# Patient Record
Sex: Female | Born: 1940 | Race: White | Hispanic: No | Smoking: Former smoker
Health system: Southern US, Community
[De-identification: ages and names within clinical notes are randomized; demographics above are authoritative.]

## PROBLEM LIST (undated history)

## (undated) DIAGNOSIS — E079 Disorder of thyroid, unspecified: Secondary | ICD-10-CM

## (undated) DIAGNOSIS — K219 Gastro-esophageal reflux disease without esophagitis: Secondary | ICD-10-CM

## (undated) DIAGNOSIS — M797 Fibromyalgia: Secondary | ICD-10-CM

## (undated) DIAGNOSIS — J383 Other diseases of vocal cords: Secondary | ICD-10-CM

## (undated) DIAGNOSIS — I10 Essential (primary) hypertension: Secondary | ICD-10-CM

## (undated) HISTORY — PX: JOINT REPLACEMENT: SHX530

## (undated) HISTORY — DX: Fibromyalgia: M79.7

## (undated) HISTORY — DX: Other diseases of vocal cords: J38.3

## (undated) HISTORY — DX: Gastro-esophageal reflux disease without esophagitis: K21.9

## (undated) HISTORY — DX: Disorder of thyroid, unspecified: E07.9

## (undated) HISTORY — DX: Essential (primary) hypertension: I10

---

## 2009-04-13 ENCOUNTER — Ambulatory Visit (HOSPITAL_COMMUNITY): Admission: RE | Admit: 2009-04-13 | Discharge: 2009-04-13 | Payer: Self-pay | Admitting: Gastroenterology

## 2010-11-17 ENCOUNTER — Emergency Department (HOSPITAL_COMMUNITY)
Admission: EM | Admit: 2010-11-17 | Discharge: 2010-11-17 | Disposition: A | Discharge status: Home / Self Care | Payer: Medicare Other | Attending: Emergency Medicine | Admitting: Emergency Medicine

## 2010-11-17 DIAGNOSIS — I10 Essential (primary) hypertension: Secondary | ICD-10-CM | POA: Insufficient documentation

## 2010-11-17 DIAGNOSIS — M79609 Pain in unspecified limb: Secondary | ICD-10-CM | POA: Insufficient documentation

## 2010-11-17 DIAGNOSIS — Z79899 Other long term (current) drug therapy: Secondary | ICD-10-CM | POA: Insufficient documentation

## 2010-11-17 DIAGNOSIS — E039 Hypothyroidism, unspecified: Secondary | ICD-10-CM | POA: Insufficient documentation

## 2010-11-17 DIAGNOSIS — M7989 Other specified soft tissue disorders: Secondary | ICD-10-CM

## 2010-11-17 LAB — BASIC METABOLIC PANEL
CO2: 27 mEq/L (ref 19–32)
Calcium: 9 mg/dL (ref 8.4–10.5)
Creatinine, Ser: 0.68 mg/dL (ref 0.50–1.10)
GFR calc Af Amer: >60 mL/min (ref >60)
GFR calc non Af Amer: >60 mL/min (ref >60)
Sodium: 137 mEq/L (ref 135–145)

## 2010-11-17 LAB — CBC
MCH: 31.4 pg (ref 26.0–34.0)
MCHC: 33.4 g/dL (ref 30.0–36.0)
MCV: 94 fL (ref 78.0–100.0)
Platelets: 196 10*3/uL (ref 150–400)
RBC: 3.15 MIL/uL — ABNORMAL LOW (ref 3.87–5.11)
RDW: 13.4 % (ref 11.5–15.5)

## 2010-11-17 LAB — DIFFERENTIAL
Basophils Relative: 0 % (ref 0–1)
Eosinophils Absolute: 0.1 10*3/uL (ref 0.0–0.7)
Eosinophils Relative: 1 % (ref 0–5)
Monocytes Relative: 8 % (ref 3–12)
Neutrophils Relative %: 71 % (ref 43–77)

## 2013-08-16 ENCOUNTER — Telehealth: Payer: Self-pay | Admitting: Interventional Cardiology

## 2013-08-16 NOTE — Telephone Encounter (Signed)
New message          There is an opening at the [facility] after 3pm today for a CT scan at the [facility] for [facility] / phone number [phone]. Could you let pt know that it is ok so that they can go to this appt.

## 2015-04-06 ENCOUNTER — Ambulatory Visit
Admission: RE | Admit: 2015-04-06 | Discharge: 2015-04-06 | Disposition: A | Discharge status: Home / Self Care | Payer: Medicare Other | Source: Ambulatory Visit | Attending: Internal Medicine | Admitting: Internal Medicine

## 2015-04-06 ENCOUNTER — Other Ambulatory Visit: Payer: Self-pay | Admitting: Internal Medicine

## 2015-04-06 DIAGNOSIS — M5412 Radiculopathy, cervical region: Secondary | ICD-10-CM

## 2016-03-07 ENCOUNTER — Encounter: Payer: Self-pay | Admitting: Emergency Medicine

## 2016-03-07 ENCOUNTER — Emergency Department (INDEPENDENT_AMBULATORY_CARE_PROVIDER_SITE_OTHER): Payer: Medicare HMO

## 2016-03-07 ENCOUNTER — Emergency Department
Admission: EM | Admit: 2016-03-07 | Discharge: 2016-03-07 | Disposition: A | Discharge status: Home / Self Care | Payer: Medicare HMO | Source: Home / Self Care | Attending: Family Medicine | Admitting: Family Medicine

## 2016-03-07 DIAGNOSIS — R1032 Left lower quadrant pain: Secondary | ICD-10-CM

## 2016-03-07 DIAGNOSIS — K5900 Constipation, unspecified: Secondary | ICD-10-CM

## 2016-03-07 LAB — POCT CBC W AUTO DIFF (K'VILLE URGENT CARE)

## 2016-03-07 MED ORDER — CIPROFLOXACIN HCL 500 MG PO TABS: 500.0000 mg | ORAL_TABLET | Freq: Two times a day (BID) | ORAL | 0 refills | Status: DC

## 2016-03-07 MED ORDER — METRONIDAZOLE 500 MG PO TABS: 500.0000 mg | ORAL_TABLET | Freq: Four times a day (QID) | ORAL | 0 refills | Status: DC

## 2016-03-07 NOTE — ED Triage Notes (Addendum)
Constipation, patient had a BM on Tuesday night and one when she arrived at our office tonight. She has been taking Senna and Probiotics for a few weeks ans has been experiencing cramping since last night. Has an appt with her PCP in the morning.

## 2016-03-07 NOTE — Discharge Instructions (Signed)
Begin clear liquids for about 18 to 24 hours, then may begin a [employer] (Bananas, Rice, Applesauce, Toast). May take Tylenol as needed for fever, pain.   If symptoms become significantly worse during the night or over the weekend, proceed to the local emergency room.

## 2016-03-07 NOTE — ED Provider Notes (Signed)
[facility]    CSN: [id number] Arrival date & time: 03/07/16  1851     History   Chief Complaint Chief Complaint  Patient presents with  . Constipation    HPI Kathryn Pope is a 75 y.o. female.   Patient complains of increased constipation for about 3 weeks.  She has been taking Senna and probiotics without much improvement.  Last night she began developing abdominal cramping, and today has had increased fatigue and chills.  No nausea/vomiting.  She had a normal bowel movement two days ago, and one when she arrived at our office. She had a negative colonoscopy 5 years ago.  She has a history of hypothyroid. Patient reports that she has a follow-up appointment with her PCP tomorrow.   The history is provided by the patient.  Constipation  Severity:  Moderate Time since last bowel movement:  1 hour Timing:  Intermittent Progression:  Worsening Chronicity:  New Context: not dehydration, not dietary changes, not medication, not narcotics and not stress   Stool description:  Formed Relieved by:  Stool softeners and laxatives Worsened by:  Nothing Ineffective treatments:  Diet changes Associated symptoms: abdominal pain   Associated symptoms: no anorexia, no back pain, no diarrhea, no dysuria, no fever, no flatus, no hematochezia, no nausea, no urinary retention and no vomiting   Risk factors comment:  Hypothyroid   Past Medical History:  Diagnosis Date  . Dysphonia spastica   . Fibromyalgia   . GERD (gastroesophageal reflux disease)   . Hypertension   . Thyroid disease     Active Medical Problems:  Hypertension; hypothyroid   History reviewed:  Laminectomy L4-5 Bilateral total hip replacements 2012  OB History    No data available       Home Medications    Prior to Admission medications   Medication Sig Start Date End Date Taking? Authorizing Provider  Probiotic Product (SOLUBLE FIBER/PROBIOTICS PO) Take by mouth.   Yes Historical Provider, MD   sennosides-docusate sodium (SENOKOT-S) 8.6-50 MG tablet Take 1 tablet by mouth daily.   Yes Historical Provider, MD  ciprofloxacin (CIPRO) 500 MG tablet Take 1 tablet (500 mg total) by mouth 2 (two) times daily. 03/07/16   Kandra Nicolas, MD  HYDROcodone-acetaminophen (NORCO/VICODIN) 5-325 MG per tablet Take 1 tablet by mouth every 6 (six) hours as needed for moderate pain.    Historical Provider, MD  ibuprofen (ADVIL,MOTRIN) 200 MG tablet Take 200 mg by mouth every 6 (six) hours as needed.    Historical Provider, MD  levothyroxine (SYNTHROID, LEVOTHROID) 75 MCG tablet Take 75 mcg by mouth daily before breakfast.    Historical Provider, MD  metaxalone (SKELAXIN) 800 MG tablet Take 800 mg by mouth 3 (three) times daily.    Historical Provider, MD  methocarbamol (ROBAXIN) 500 MG tablet Take 500 mg by mouth 4 (four) times daily.    Historical Provider, MD  metroNIDAZOLE (FLAGYL) 500 MG tablet Take 1 tablet (500 mg total) by mouth 4 (four) times daily. 03/07/16   Kandra Nicolas, MD  nitroGLYCERIN (NITROSTAT) 0.4 MG SL tablet Place 0.4 mg under the tongue every 5 (five) minutes as needed for chest pain.    Historical Provider, MD  omeprazole (PRILOSEC) 20 MG capsule Take 20 mg by mouth daily.    Historical Provider, MD  valsartan (DIOVAN) 320 MG tablet Take 320 mg by mouth daily.    Historical Provider, MD  VITAMIN D, CHOLECALCIFEROL, PO Take by mouth.    Historical Provider,  MD    Family History No family history on file.  Social History Social History  Substance Use Topics  . Smoking status: Former Research scientist (life sciences)  . Smokeless tobacco: Never Used     Comment: QUIT 1965  . Alcohol use Not on file     Allergies   Ace inhibitors and Hctz [hydrochlorothiazide]   Review of Systems Review of Systems  Constitutional: Positive for chills and fatigue. Negative for activity change, appetite change, diaphoresis, fever and unexpected weight change.  Gastrointestinal: Positive for abdominal pain and  constipation. Negative for anorexia, diarrhea, flatus, hematochezia, nausea and vomiting.  Genitourinary: Negative for dysuria.  Musculoskeletal: Negative for back pain.  All other systems reviewed and are negative.    Physical Exam Triage Vital Signs ED Triage Vitals  Enc Vitals Group     BP 03/07/16 1911 107/71     Pulse Rate 03/07/16 1911 107     Resp --      Temp 03/07/16 1911 98.1 F (36.7 C)     Temp Source 03/07/16 1911 Oral     SpO2 03/07/16 1911 97 %     Weight 03/07/16 1912 167 lb (75.8 kg)     Height 03/07/16 1912 5\' 5"  (1.651 m)     Head Circumference --      Peak Flow --      Pain Score 03/07/16 1915 3     Pain Loc --      Pain Edu? --      Excl. in [place]? --    No data found.   Updated Vital Signs BP 107/71 (BP Location: Left Arm)   Pulse 107   Temp 98.1 F (36.7 C) (Oral)   Ht 5\' 5"  (1.651 m)   Wt 167 lb (75.8 kg)   SpO2 97%   BMI 27.79 kg/m   Visual Acuity Right Eye Distance:   Left Eye Distance:   Bilateral Distance:    Right Eye Near:   Left Eye Near:    Bilateral Near:     Physical Exam Nursing notes and Vital Signs reviewed. Appearance:  Patient appears stated age, and in no acute distress Eyes:  Pupils are equal, round, and reactive to light and accomodation.  Extraocular movement is intact.  Conjunctivae are not inflamed  Ears:  Canals normal.  Tympanic membranes normal.  Nose:   Normal turbinates.  No sinus tenderness.     Pharynx:  Normal; moist mucous membranes  Neck:  Supple.  No adenopathy.  No thyromegaly. Lungs:  Clear to auscultation.  Breath sounds are equal.  Moving air well. Heart:  Regular rate and rhythm without murmurs, rubs, or gallops.  Rate 104 Abdomen:   Vague mild tenderness hypogastric area, more pronounced on the left.  No masses or hepatosplenomegaly.  Bowel sounds are present.  No CVA or flank tenderness.  Extremities:  No edema.  Skin:  No rash present.    UC Treatments / Results  Labs (all labs ordered are  listed, but only abnormal results are displayed) Labs Reviewed  POCT CBC W AUTO DIFF ([facility]):  WBC 15.8; LY 21.3; MO 4.1; GR 74.6; Hgb 14.4; Platelets 254     EKG  EKG Interpretation None       Radiology Dg Abdomen 1 View  Result Date: 03/07/2016 CLINICAL DATA:  Left lower quadrant pain EXAM: ABDOMEN - 1 VIEW COMPARISON:  None. FINDINGS: Visualized lung bases are clear. Nonobstructed gas pattern with large amount of stool in the left colon and  rectum. Faint opacities in the right hemi abdomen could be within the bowel stream. Status post bilateral hip replacement, left acetabular screw protrudes beyond left inner cortical margin of the pelvis. IMPRESSION: Nonobstructed bowel gas pattern with large amount of stool in the left colon and rectum Electronically Signed   By: Donavan Foil M.D.   On: 03/07/2016 20:19    Procedures Procedures (including critical care time)  Medications Ordered in UC Medications - No data to display   Initial Impression / Assessment and Plan / UC Course  I have reviewed the triage vital signs and the nursing notes.  Pertinent labs & imaging results that were available during my care of the patient were reviewed by me and considered in my medical decision making (see chart for details).  Clinical Course   Note significant leukocytosis 15.8 Suspect diverticulitis. Begin Cipro and Flagyl. Begin clear liquids for about 18 to 24 hours, then may begin a [employer] (Bananas, Rice, Applesauce, Toast). May take Tylenol as needed for fever, pain. Followup with PCP tomorrow as scheduled.  Note history of hypothyroid.  Patient's increasing constipation may be a consequence of decreased control of hypothyroid.  Will need repeat TSH.   Also recommend follow-up with orthopedist (note left acetabular screw protruding beyond left inner cortical margin of pelvis on KUB). If symptoms become significantly worse during the night or over the weekend, proceed to  the local emergency room.      Final Clinical Impressions(s) / UC Diagnoses   Final diagnoses:  Constipation, unspecified constipation type  Left lower quadrant pain    New Prescriptions New Prescriptions   CIPROFLOXACIN (CIPRO) 500 MG TABLET    Take 1 tablet (500 mg total) by mouth 2 (two) times daily.   METRONIDAZOLE (FLAGYL) 500 MG TABLET    Take 1 tablet (500 mg total) by mouth 4 (four) times daily.     Kandra Nicolas, MD 03/10/16 1145

## 2016-05-14 LAB — COLOGUARD: COLOGUARD: POSITIVE

## 2016-06-28 ENCOUNTER — Other Ambulatory Visit: Payer: Self-pay | Admitting: Gastroenterology

## 2016-07-15 ENCOUNTER — Other Ambulatory Visit: Payer: Self-pay | Admitting: Gastroenterology

## 2016-07-15 ENCOUNTER — Encounter (HOSPITAL_COMMUNITY): Payer: Self-pay | Admitting: Anesthesiology

## 2016-07-15 ENCOUNTER — Ambulatory Visit (HOSPITAL_COMMUNITY): Admission: RE | Admit: 2016-07-15 | Payer: Medicare Other | Source: Ambulatory Visit | Admitting: Gastroenterology

## 2016-07-15 SURGERY — COLONOSCOPY WITH PROPOFOL
Anesthesia: Monitor Anesthesia Care

## 2016-07-25 ENCOUNTER — Other Ambulatory Visit: Payer: Self-pay | Admitting: Gastroenterology

## 2016-08-13 ENCOUNTER — Ambulatory Visit (HOSPITAL_COMMUNITY)
Admission: RE | Admit: 2016-08-13 | Discharge: 2016-08-13 | Disposition: A | Discharge status: Home / Self Care | Payer: Medicare Other | Source: Ambulatory Visit | Attending: Gastroenterology | Admitting: Gastroenterology

## 2016-08-13 ENCOUNTER — Encounter (HOSPITAL_COMMUNITY)
Admission: RE | Disposition: A | Discharge status: Home / Self Care | Payer: Self-pay | Source: Ambulatory Visit | Attending: Gastroenterology

## 2016-08-13 ENCOUNTER — Ambulatory Visit (HOSPITAL_COMMUNITY): Payer: Medicare Other | Admitting: Certified Registered Nurse Anesthetist

## 2016-08-13 ENCOUNTER — Encounter (HOSPITAL_COMMUNITY): Payer: Self-pay | Admitting: *Deleted

## 2016-08-13 DIAGNOSIS — Z888 Allergy status to other drugs, medicaments and biological substances status: Secondary | ICD-10-CM | POA: Insufficient documentation

## 2016-08-13 DIAGNOSIS — K921 Melena: Secondary | ICD-10-CM | POA: Diagnosis not present

## 2016-08-13 DIAGNOSIS — Z1211 Encounter for screening for malignant neoplasm of colon: Secondary | ICD-10-CM | POA: Diagnosis present

## 2016-08-13 DIAGNOSIS — H409 Unspecified glaucoma: Secondary | ICD-10-CM | POA: Insufficient documentation

## 2016-08-13 DIAGNOSIS — D122 Benign neoplasm of ascending colon: Secondary | ICD-10-CM | POA: Insufficient documentation

## 2016-08-13 DIAGNOSIS — Z9841 Cataract extraction status, right eye: Secondary | ICD-10-CM | POA: Insufficient documentation

## 2016-08-13 DIAGNOSIS — E039 Hypothyroidism, unspecified: Secondary | ICD-10-CM | POA: Diagnosis not present

## 2016-08-13 DIAGNOSIS — Z880 Allergy status to penicillin: Secondary | ICD-10-CM | POA: Diagnosis not present

## 2016-08-13 DIAGNOSIS — I10 Essential (primary) hypertension: Secondary | ICD-10-CM | POA: Diagnosis not present

## 2016-08-13 DIAGNOSIS — Z981 Arthrodesis status: Secondary | ICD-10-CM | POA: Diagnosis not present

## 2016-08-13 DIAGNOSIS — Z96641 Presence of right artificial hip joint: Secondary | ICD-10-CM | POA: Insufficient documentation

## 2016-08-13 DIAGNOSIS — M797 Fibromyalgia: Secondary | ICD-10-CM | POA: Diagnosis not present

## 2016-08-13 HISTORY — PX: COLONOSCOPY WITH PROPOFOL: SHX5780

## 2016-08-13 SURGERY — COLONOSCOPY WITH PROPOFOL
Anesthesia: Monitor Anesthesia Care

## 2016-08-13 MED ORDER — LIDOCAINE 2% (20 MG/ML) 5 ML SYRINGE
INTRAMUSCULAR | Status: AC
  Filled 2016-08-13: qty 5

## 2016-08-13 MED ORDER — SODIUM CHLORIDE 0.9 % IV SOLN
INTRAVENOUS | Status: DC
  Administered 2016-08-13: 14:00:00 via INTRAVENOUS

## 2016-08-13 MED ORDER — PROPOFOL 10 MG/ML IV BOLUS
INTRAVENOUS | Status: AC
  Filled 2016-08-13: qty 40

## 2016-08-13 MED ORDER — PROPOFOL 10 MG/ML IV BOLUS
INTRAVENOUS | Status: AC
  Filled 2016-08-13: qty 20

## 2016-08-13 MED ORDER — PROPOFOL 500 MG/50ML IV EMUL
INTRAVENOUS | Status: DC | PRN
  Administered 2016-08-13: 150 ug/kg/min via INTRAVENOUS

## 2016-08-13 MED ORDER — ONDANSETRON HCL 4 MG/2ML IJ SOLN
INTRAMUSCULAR | Status: DC | PRN
  Administered 2016-08-13: 4 mg via INTRAVENOUS

## 2016-08-13 MED ORDER — ONDANSETRON HCL 4 MG/2ML IJ SOLN
INTRAMUSCULAR | Status: AC
  Filled 2016-08-13: qty 2

## 2016-08-13 MED ORDER — LIDOCAINE 2% (20 MG/ML) 5 ML SYRINGE
INTRAMUSCULAR | Status: DC | PRN
  Administered 2016-08-13: 100 mg via INTRAVENOUS

## 2016-08-13 MED ORDER — LACTATED RINGERS IV SOLN
INTRAVENOUS | Status: DC
  Administered 2016-08-13: 1000 mL via INTRAVENOUS

## 2016-08-13 SURGICAL SUPPLY — 21 items

## 2016-08-13 NOTE — Transfer of Care (Signed)
Immediate Anesthesia Transfer of Care Note  Patient: Kathryn Pope  Procedure(s) Performed: Procedure(s): COLONOSCOPY WITH PROPOFOL (N/A)  Patient Location: PACU  Anesthesia Type:MAC  Level of Consciousness:  sedated, patient cooperative and responds to stimulation  Airway & Oxygen Therapy:Patient Spontanous Breathing and Patient connected to face mask oxgen  Post-op Assessment:  Report given to PACU RN and Post -op Vital signs reviewed and stable  Post vital signs:  Reviewed and stable  Last Vitals:  Vitals:   08/13/16 1230  BP: (!) 145/68  Pulse: 81  Resp: 13  Temp: 25.3 C    Complications: No apparent anesthesia complications

## 2016-08-13 NOTE — H&P (Signed)
Procedure: Screening colonoscopy to evaluate positive cologuard stool colon cancer screening test. Normal screening colonoscopies were performed in 2007 and 2011  History: The patient is a 76 year old female born [birth year]. She is scheduled to undergo a repeat screening colonoscopy to evaluate a positive cologuard stool colon cancer screening test.  Past medical history: Fibromyalgia. Hypertension. Hypothyroidism. Glaucoma. Arthroscopic knee surgery. Right hip replacement surgery. Rectocele surgery. Right cataract surgery. Lumbar laminectomy with fusion.  Medication allergies: Penicillin caused [place] infection. Hydrochlorothiazide caused fatigue. ACE inhibitors cause cough.  Exam: The patient is alert and lying comfortably on the endoscopy stretcher. Lungs are clear to auscultation. Cardiac exam reveals a regular rhythm. Abdomen is soft and nontender to palpation.  Plan: Proceed with screening colonoscopy

## 2016-08-13 NOTE — Anesthesia Preprocedure Evaluation (Signed)
Anesthesia Evaluation  Patient identified by MRN, date of birth, ID band Patient awake    Reviewed: Allergy & Precautions, NPO status , Patient's Chart, lab work & pertinent test results  History of Anesthesia Complications Negative for: history of anesthetic complications  Airway Mallampati: II  TM Distance: >3 FB Neck ROM: Full    Dental no notable dental hx. (+) Dental Advisory Given   Pulmonary former smoker,    Pulmonary exam normal        Cardiovascular hypertension, Pt. on medications Normal cardiovascular exam     Neuro/Psych    GI/Hepatic Neg liver ROS, GERD  ,  Endo/Other  negative endocrine ROS  Renal/GU negative Renal ROS     Musculoskeletal   Abdominal   Peds  Hematology   Anesthesia Other Findings   Reproductive/Obstetrics                             Anesthesia Physical Anesthesia Plan  ASA: III  Anesthesia Plan: MAC   Post-op Pain Management:    Induction:   Airway Management Planned: Natural Airway and Simple Face Mask  Additional Equipment:   Intra-op Plan:   Post-operative Plan:   Informed Consent: I have reviewed the patients History and Physical, chart, labs and discussed the procedure including the risks, benefits and alternatives for the proposed anesthesia with the patient or authorized representative who has indicated his/her understanding and acceptance.   Dental advisory given and Consent reviewed with POA  Plan Discussed with: CRNA and Anesthesiologist  Anesthesia Plan Comments:         Anesthesia Quick Evaluation

## 2016-08-13 NOTE — Discharge Instructions (Signed)

## 2016-08-13 NOTE — Op Note (Signed)
[facility] Patient Name: Kathryn Pope Procedure Date: 08/13/2016 MRN: [id number] Attending MD: Garlan Fair , MD Date of Birth: 12/07/1940 CSN: [id number] Age: 76 Admit Type: Outpatient Procedure:                Colonoscopy Indications:              Screening for colorectal malignant neoplasm Providers:                Garlan Fair, MD, Hilma Favors, RN, Cherylynn Ridges, Technician, Alan Mulder, Technician,                            Christell Faith, CRNA Referring MD:              Medicines:                Propofol per Anesthesia Complications:            No immediate complications. Estimated Blood Loss:     Estimated blood loss was minimal. Procedure:                Pre-Anesthesia Assessment:                           - Prior to the procedure, a History and Physical                            was performed, and patient medications and                            allergies were reviewed. The patient's tolerance of                            previous anesthesia was also reviewed. The risks                            and benefits of the procedure and the sedation                            options and risks were discussed with the patient.                            All questions were answered, and informed consent                            was obtained. Prior Anticoagulants: The patient has                            taken no previous anticoagulant or antiplatelet                            agents. ASA Grade Assessment: II - A patient with  mild systemic disease. After reviewing the risks                            and benefits, the patient was deemed in                            satisfactory condition to undergo the procedure.                           After obtaining informed consent, the colonoscope                            was passed under direct vision. Throughout the   procedure, the patient's blood pressure, pulse, and                            oxygen saturations were monitored continuously. The                            [id number] ([id number]) scope was introduced through                            the anus and advanced to the the cecum, identified                            by appendiceal orifice and ileocecal valve. The                            colonoscopy was technically difficult and complex                            due to significant looping. The patient tolerated                            the procedure well. The quality of the bowel                            preparation was good. The appendiceal orifice and                            the rectum were photographed. Scope In: 1:27:10 PM Scope Out: 2:02:26 PM Scope Withdrawal Time: 0 hours 15 minutes 17 seconds  Total Procedure Duration: 0 hours 35 minutes 16 seconds  Findings:      The perianal and digital rectal examinations were normal.      A 5 mm polyp was found in the mid ascending colon. The polyp was       sessile. The polyp was removed with a cold snare. Resection and       retrieval were complete.      The exam was otherwise without abnormality. Extensive left colonic       diverticulosis was present. Impression:               - One 5 mm polyp in the mid ascending colon,  removed with a cold snare. Resected and retrieved.                           - The examination was otherwise normal. Moderate Sedation:      N/A- Per Anesthesia Care Recommendation:           - Patient has a contact number available for                            emergencies. The signs and symptoms of potential                            delayed complications were discussed with the                            patient. Return to normal activities tomorrow.                            Written discharge instructions were provided to the                            patient.                            - Repeat colonoscopy is not recommended for                            surveillance.                           - Resume previous diet.                           - Continue present medications. Procedure Code(s):        --- Professional ---                           [id number], Colonoscopy, flexible; with removal of                            tumor(s), polyp(s), or other lesion(s) by snare                            technique Diagnosis Code(s):        --- Professional ---                           Z12.11, Encounter for screening for malignant                            neoplasm of colon                           D12.2, Benign neoplasm of ascending colon CPT copyright 2016 [employer]. All rights reserved. The codes documented in this report are preliminary and upon coder review may  be revised to meet current compliance requirements. Kathryn Gell, MD Kathryn Pope  Kathryn Senter, MD 08/13/2016 2:10:09 PM This report has been signed electronically. Number of Addenda: 0

## 2016-08-13 NOTE — Anesthesia Postprocedure Evaluation (Addendum)
Anesthesia Post Note  Patient: Kathryn Pope  Procedure(s) Performed: Procedure(s) (LRB): COLONOSCOPY WITH PROPOFOL (N/A)  Patient location during evaluation: Endoscopy Anesthesia Type: MAC Level of consciousness: awake and alert Pain management: pain level controlled Vital Signs Assessment: post-procedure vital signs reviewed and stable Respiratory status: spontaneous breathing and respiratory function stable Cardiovascular status: stable Anesthetic complications: no       Last Vitals:  Vitals:   08/13/16 1420 08/13/16 1430  BP: 132/70 (!) 152/73  Pulse: 72 68  Resp: 15 18  Temp:      Last Pain:  Vitals:   08/13/16 1230  TempSrc: Oral                 Janathan Bribiesca DANIEL

## 2016-08-15 ENCOUNTER — Encounter (HOSPITAL_COMMUNITY): Payer: Self-pay | Admitting: Gastroenterology

## 2016-09-07 NOTE — Addendum Note (Signed)
Addendum  created 09/07/16 0932 by Duane Boston, MD   Sign clinical note

## 2016-09-19 ENCOUNTER — Ambulatory Visit
Admission: RE | Admit: 2016-09-19 | Discharge: 2016-09-19 | Disposition: A | Discharge status: Home / Self Care | Payer: Medicare Other | Source: Ambulatory Visit | Attending: Internal Medicine | Admitting: Internal Medicine

## 2016-09-19 ENCOUNTER — Other Ambulatory Visit: Payer: Self-pay | Admitting: Internal Medicine

## 2016-09-19 DIAGNOSIS — M5416 Radiculopathy, lumbar region: Secondary | ICD-10-CM

## 2016-09-20 ENCOUNTER — Ambulatory Visit
Admission: RE | Admit: 2016-09-20 | Discharge: 2016-09-20 | Disposition: A | Discharge status: Home / Self Care | Payer: Medicare Other | Source: Ambulatory Visit | Attending: Internal Medicine | Admitting: Internal Medicine

## 2016-09-20 DIAGNOSIS — M5416 Radiculopathy, lumbar region: Secondary | ICD-10-CM

## 2016-09-20 MED ORDER — GADOBENATE DIMEGLUMINE 529 MG/ML IV SOLN
15.0000 mL | Freq: Once | INTRAVENOUS | Status: AC | PRN
  Administered 2016-09-20: 15 mL via INTRAVENOUS

## 2016-09-23 ENCOUNTER — Ambulatory Visit (HOSPITAL_COMMUNITY): Admit: 2016-09-23 | Payer: Medicare HMO | Admitting: Gastroenterology

## 2016-09-23 ENCOUNTER — Encounter (HOSPITAL_COMMUNITY): Payer: Self-pay

## 2016-09-23 SURGERY — COLONOSCOPY WITH PROPOFOL
Anesthesia: Monitor Anesthesia Care

## 2016-10-16 ENCOUNTER — Encounter: Payer: Self-pay | Admitting: Gastroenterology

## 2016-10-24 ENCOUNTER — Ambulatory Visit: Payer: Medicare Other | Admitting: Physician Assistant

## 2016-12-06 ENCOUNTER — Ambulatory Visit: Payer: Medicare Other | Admitting: Gastroenterology

## 2017-05-22 DIAGNOSIS — J383 Other diseases of vocal cords: Secondary | ICD-10-CM | POA: Insufficient documentation

## 2018-01-13 DIAGNOSIS — H401113 Primary open-angle glaucoma, right eye, severe stage: Secondary | ICD-10-CM | POA: Insufficient documentation

## 2018-05-04 ENCOUNTER — Emergency Department
Admission: EM | Admit: 2018-05-04 | Discharge: 2018-05-04 | Disposition: A | Discharge status: Home / Self Care | Payer: Medicare Other | Source: Home / Self Care | Attending: Family Medicine | Admitting: Family Medicine

## 2018-05-04 ENCOUNTER — Other Ambulatory Visit: Payer: Self-pay

## 2018-05-04 ENCOUNTER — Emergency Department (INDEPENDENT_AMBULATORY_CARE_PROVIDER_SITE_OTHER): Payer: Medicare Other

## 2018-05-04 DIAGNOSIS — R05 Cough: Secondary | ICD-10-CM | POA: Diagnosis not present

## 2018-05-04 DIAGNOSIS — Z711 Person with feared health complaint in whom no diagnosis is made: Secondary | ICD-10-CM

## 2018-05-04 DIAGNOSIS — R053 Chronic cough: Secondary | ICD-10-CM

## 2018-05-04 MED ORDER — CEFDINIR 300 MG PO CAPS: 300.0000 mg | ORAL_CAPSULE | Freq: Two times a day (BID) | ORAL | 0 refills | Status: AC

## 2018-05-04 NOTE — ED Provider Notes (Signed)
[name]    CSN: [id number] Arrival date & time: 05/04/18  1405     History   Chief Complaint Chief Complaint  Patient presents with  . Aspiration    HPI Kathryn Pope is a 78 y.o. female.   Patient states that about 2 hours ago while taking her daily pills, she coughed and believes that she may have aspirated one of her pills.  She had a cough prior to her present symptoms but believes it is now worse.  She feels discomfort in her right posterior chest when coughing.  The history is provided by the patient.    Past Medical History:  Diagnosis Date  . Dysphonia spastica   . Fibromyalgia   . GERD (gastroesophageal reflux disease)   . Hypertension   . Thyroid disease     Active problems:  Fibromyalgia, GERD, hypertension, thyroid disease,   Past Surgical History:  Procedure Laterality Date  . COLONOSCOPY WITH PROPOFOL N/A 08/13/2016   Procedure: COLONOSCOPY WITH PROPOFOL;  Surgeon: Garlan Fair, MD;  Location: [facility];  Service: Endoscopy;  Laterality: N/A;  . JOINT REPLACEMENT     both hips       Home Medications    Prior to Admission medications   Medication Sig Start Date End Date Taking? Authorizing Provider  Ascorbic Acid (VITAMIN C) 1000 MG tablet Take 1,000 mg by mouth daily.    [provider]  B Complex-C (SUPER B COMPLEX PO) Take 1 tablet by mouth daily.    [provider]  CALCIUM-MAGNESIUM-VITAMIN D PO Take 1 tablet by mouth daily.    [provider]  cefdinir (OMNICEF) 300 MG capsule Take 1 capsule (300 mg total) by mouth 2 (two) times daily. 05/04/18   Kandra Nicolas, MD  diphenhydramine-acetaminophen (TYLENOL PM) 25-500 MG TABS tablet Take 1 tablet by mouth at bedtime as needed (sleep).    [provider]  HYDROcodone-acetaminophen (NORCO/VICODIN) 5-325 MG per tablet Take 1 tablet by mouth daily as needed for severe pain.     [provider]  ibuprofen (ADVIL,MOTRIN) 200 MG  tablet Take 400-800 mg by mouth 3 (three) times daily as needed for headache or moderate pain.     [provider]  latanoprost (XALATAN) 0.005 % ophthalmic solution Place 1 drop into the right eye at bedtime. 06/20/16   [provider]  levothyroxine (SYNTHROID, LEVOTHROID) 75 MCG tablet Take 75 mcg by mouth daily before breakfast.    [provider]  nitroGLYCERIN (NITROSTAT) 0.4 MG SL tablet Place 0.4 mg under the tongue 3 (three) times daily as needed (Esophageal spasm).     [provider]  omeprazole (PRILOSEC) 40 MG capsule Take 40 mg by mouth daily.    [provider]  polyethylene glycol (MIRALAX / GLYCOLAX) packet Take 17 g by mouth daily.    [provider]  Probiotic CAPS Take 1 capsule by mouth daily.    [provider]  Probiotic Product (SOLUBLE FIBER/PROBIOTICS PO) Take 1 capsule by mouth at bedtime.     [provider]  valsartan (DIOVAN) 320 MG tablet Take 320 mg by mouth daily.    [provider]    Family History History reviewed:  Glaucoma, diabetes, goiter, alzheimer's disease  Social History Social History   Tobacco Use  . Smoking status: Former Research scientist (life sciences)  . Smokeless tobacco: Never Used  . Tobacco comment: QUIT 1965  Substance Use Topics  . Alcohol use: Not on file  . Drug  use: Not on file     Allergies   Ace inhibitors; Demerol [meperidine]; and Hctz [hydrochlorothiazide]   Review of Systems Review of Systems No sore throat + cough ? pleuritic pain; she complains of vague discomfort in her right posterior chest when coughing No wheezing No nasal congestion ? post-nasal drainage No sinus pain/pressure No itchy/red eyes No earache No hemoptysis No SOB No fever/chills No nausea No vomiting No abdominal pain No diarrhea No urinary symptoms No skin rash No fatigue No myalgias No headache    Physical Exam Triage Vital Signs ED Triage Vitals  Enc Vitals Group      BP 05/04/18 1445 127/84     Pulse Rate 05/04/18 1445 90     Resp 05/04/18 1445 20     Temp 05/04/18 1445 97.8 F (36.6 C)     Temp Source 05/04/18 1445 Oral     SpO2 05/04/18 1445 93 %     Weight 05/04/18 1446 172 lb (78 kg)     Height 05/04/18 1446 5\' 5"  (1.651 m)     Head Circumference --      Peak Flow --      Pain Score 05/04/18 1446 2     Pain Loc --      Pain Edu? --      Excl. in [place]? --    No data found.  Updated Vital Signs BP 127/84 (BP Location: Right Arm)   Pulse 90   Temp 97.8 F (36.6 C) (Oral)   Resp 20   Ht 5\' 5"  (1.651 m)   Wt 78 kg   SpO2 93%   BMI 28.62 kg/m   Visual Acuity Right Eye Distance:   Left Eye Distance:   Bilateral Distance:    Right Eye Near:   Left Eye Near:    Bilateral Near:     Physical Exam Nursing notes and Vital Signs reviewed. Appearance:  Patient appears stated age, and in no acute distress Eyes:  Pupils are equal, round, and reactive to light and accomodation.  Extraocular movement is intact.  Conjunctivae are not inflamed  Ears:  Canals normal.  Tympanic membranes normal.  Nose:  Mildly congested turbinates.  No sinus tenderness.   Pharynx:  Normal Neck:  Supple.  No adenopathy.   Lungs:  Clear to auscultation.  Breath sounds are equal.  Moving air well. Heart:  Regular rate and rhythm without murmurs, rubs, or gallops.  Abdomen:  Nontender without masses or hepatosplenomegaly.  Bowel sounds are present.  No CVA or flank tenderness.  Extremities:  No edema.  Skin:  No rash present.    UC Treatments / Results  Labs (all labs ordered are listed, but only abnormal results are displayed) Labs Reviewed - No data to display  EKG None  Radiology Dg Chest 2 View  Result Date: 05/04/2018 CLINICAL DATA:  The patient reports she aspirated a vitamin approximately 2 hours ago. EXAM: CHEST - 2 VIEW COMPARISON:  None. FINDINGS: No radiopaque foreign body is identified. Lungs are clear. Heart size is normal. No pneumothorax or  pleural effusion. No acute or focal bony abnormality. IMPRESSION: Negative chest.  No radiopaque foreign body is seen. Electronically Signed   By: Inge Rise M.D.   On: 05/04/2018 15:42    Procedures Procedures (including critical care time)  Medications Ordered in UC Medications - No data to display  Initial Impression / Assessment and Plan / UC Course  I have reviewed the triage vital signs and the nursing  notes.  Pertinent labs & imaging results that were available during my care of the patient were reviewed by me and considered in my medical decision making (see chart for details).    Unremarkable exam and negative chest x-ray reassuring.  Doubt pill aspiration. Will begin empiric Omnicef. Followup with Family Doctor if not improved one week   Final Clinical Impressions(s) / UC Diagnoses   Final diagnoses:  Feared condition not demonstrated  Cough, persistent     Discharge Instructions     Increase fluid intake.  Check temperature daily. May give plain guaifenesin syrup 100mg /56mL (such as plain Robitussin syrup), 1mL to 50mL every 4hour as needed for cough and congestion.    Avoid antihistamines (Benadryl, etc) for now. If symptoms become significantly worse during the night or over the weekend, proceed to the local emergency room.        ED Prescriptions    Medication Sig Dispense Auth. Provider   cefdinir (OMNICEF) 300 MG capsule Take 1 capsule (300 mg total) by mouth 2 (two) times daily. 20 capsule Kandra Nicolas, MD         Kandra Nicolas, MD 05/06/18 2039

## 2018-05-04 NOTE — Discharge Instructions (Addendum)
Increase fluid intake.  Check temperature daily. May give plain guaifenesin syrup 100mg /75mL (such as plain Robitussin syrup), 37mL to 83mL every 4hour as needed for cough and congestion.    Avoid antihistamines (Benadryl, etc) for now. If symptoms become significantly worse during the night or over the weekend, proceed to the local emergency room.

## 2018-05-04 NOTE — ED Triage Notes (Signed)
Pt feels that when she was taking medication earlier today that she aspirated one of her pills.  She is talking, and O2 sat is 93%.

## 2019-07-26 ENCOUNTER — Other Ambulatory Visit: Payer: Self-pay

## 2019-07-26 ENCOUNTER — Encounter: Payer: Self-pay | Admitting: Orthopaedic Surgery

## 2019-07-26 ENCOUNTER — Ambulatory Visit: Payer: Medicare Other | Admitting: Orthopaedic Surgery

## 2019-07-26 ENCOUNTER — Ambulatory Visit: Payer: Self-pay

## 2019-07-26 DIAGNOSIS — M1711 Unilateral primary osteoarthritis, right knee: Secondary | ICD-10-CM | POA: Diagnosis not present

## 2019-07-26 DIAGNOSIS — M1712 Unilateral primary osteoarthritis, left knee: Secondary | ICD-10-CM | POA: Diagnosis not present

## 2019-07-26 NOTE — Progress Notes (Signed)
Office Visit Note   Patient: Kathryn Pope           Date of Birth: Jan 20, 1941           MRN: [id number] Visit Date: 07/26/2019              Requested by: Josetta Huddle, MD [address] [place],  [place] [address] PCP: Josetta Huddle, MD   Assessment & Plan: Visit Diagnoses:  1. Primary osteoarthritis of left knee   2. Primary osteoarthritis of right knee     Plan: She would like to stay with conservative treatment of both knees and avoid surgery if at all possible.  Therefore recommended cortisone injections both knees.  She may benefit from supplemental injections in the knees in the future.  Dr. Ninfa Linden spoke with her about supplemental injections and told her that she could actually just call if she wanted to undergo supplemental injections in the future.  Otherwise would recommend cortisone injections no more every 3 to 4 months.  Discussed with her quad strengthening exercises.  She follow-up with [place] as needed.  Follow-Up Instructions: No follow-ups on file.   Orders:  Orders Placed This Encounter  Procedures  . XR Knee 1-2 Views Right  . XR Knee 1-2 Views Left   No orders of the defined types were placed in this encounter.     Procedures: No procedures performed   Clinical Data: No additional findings.   Subjective: Chief Complaint  Patient presents with  . Right Knee - Pain  . Left Knee - Pain    HPI Kathryn Pope is a 79 year old female were seen for the first time for bilateral knee pain.  She has had knee pain for years but is gotten worse over the last year.  She has problems going up and down steps.  Right knee is more bothersome than the left.  Sometimes her knees feel as if they may catch and feel weak.  She feels as if she loses her balance some.  Pains mostly medial aspect of both knees.  History of right knee arthroscopy some years ago.  She is also had history of bilateral hip arthroplasties and L4-5 laminectomy with fusion.  She is  nondiabetic.  She reports that she had her last Covid vaccine injection in February. Review of Systems See HPI.  Objective: Vital Signs: There were no vitals taken for this visit.  Physical Exam Constitutional:      Appearance: She is not ill-appearing or diaphoretic.  Neurological:     Mental Status: She is alert and oriented to person, place, and time.     Ortho Exam Bilateral knees no abnormal warmth erythema or effusion.  No instability valgus varus stressing of either knee.  Tenderness medial joint line of both knees.  Good range of motion of both knees without significant pain. Specialty Comments:  No specialty comments available.  Imaging: XR Knee 1-2 Views Left  Result Date: 07/26/2019 Left knee 2 views: Moderate/severe medial compartmental narrowing.  Lateral compartment with mild arthritic changes chondrocalcinosis.  Patellofemoral mild to moderate changes.  No acute fractures.  XR Knee 1-2 Views Right  Result Date: 07/26/2019 Right knee 2 views: No acute fractures.  End-stage arthritis with bone-on-bone medial compartment.  Lateral compartment tricompartmental changes and calcific changes of the meniscus.  Mild to moderate patellofemoral changes.    PMFS History: There are no problems to display for this patient.  Past Medical History:  Diagnosis Date  . Dysphonia  spastica   . Fibromyalgia   . GERD (gastroesophageal reflux disease)   . Hypertension   . Thyroid disease     History reviewed. No pertinent family history.  Past Surgical History:  Procedure Laterality Date  . COLONOSCOPY WITH PROPOFOL N/A 08/13/2016   Procedure: COLONOSCOPY WITH PROPOFOL;  Surgeon: Garlan Fair, MD;  Location: [facility];  Service: Endoscopy;  Laterality: N/A;  . JOINT REPLACEMENT     both hips   Social History   Occupational History  . Not on file  Tobacco Use  . Smoking status: Former Research scientist (life sciences)  . Smokeless tobacco: Never Used  . Tobacco comment: QUIT 1965    Substance and Sexual Activity  . Alcohol use: Not on file  . Drug use: Not on file  . Sexual activity: Not on file

## 2019-08-16 ENCOUNTER — Ambulatory Visit: Payer: Self-pay

## 2019-08-16 ENCOUNTER — Other Ambulatory Visit: Payer: Self-pay

## 2019-08-16 ENCOUNTER — Encounter: Payer: Self-pay | Admitting: Physician Assistant

## 2019-08-16 ENCOUNTER — Telehealth: Payer: Self-pay | Admitting: Radiology

## 2019-08-16 ENCOUNTER — Ambulatory Visit: Payer: Medicare Other | Admitting: Physician Assistant

## 2019-08-16 DIAGNOSIS — M25561 Pain in right knee: Secondary | ICD-10-CM

## 2019-08-16 NOTE — Telephone Encounter (Signed)
Patient may call and return for a hinged knee brace.

## 2019-08-16 NOTE — Progress Notes (Signed)
Office Visit Note   Patient: Kathryn Pope           Date of Birth: 24-Apr-1940           MRN: [id number] Visit Date: 08/16/2019              Requested by: Josetta Huddle, MD [address] [place],  [place] [address] PCP: Josetta Huddle, MD   Assessment & Plan: Visit Diagnoses:  1. Acute pain of right knee     Plan: She is activities as tolerated.  We will see her back in 2 weeks and see how the knee is doing.  She will continue conservative measures with ibuprofen ice and relative rest.  Discussed with her hinged knee brace.  She would like to see if she can find one on her own we did show a brace today in the office.  Questions were encouraged and answered at length  Follow-Up Instructions: Return in about 2 weeks (around 08/30/2019).   Orders:  Orders Placed This Encounter  Procedures  . XR Knee 1-2 Views Right   No orders of the defined types were placed in this encounter.     Procedures: No procedures performed   Clinical Data: No additional findings.   Subjective: Chief Complaint  Patient presents with  . Right Knee - Pain    HPI Kathryn Pope comes in today due to acute right knee pain.  She states she is doing very well status post bilateral knee injections 07/26/2019 with cortisone.  However yesterday she was getting in to the family members truck and had a popping sensation in the right knee knee gave way.  Pain continues and is worse with walking.  She notes her swelling.  She has been icing the knee and using off-the-shelf knee brace.  Review of Systems See HPI   Objective: Vital Signs: There were no vitals taken for this visit.  Physical Exam Constitutional:      Appearance: She is not ill-appearing or diaphoretic.  Pulmonary:     Effort: Pulmonary effort is normal.  Neurological:     Mental Status: She is alert and oriented to person, place, and time.  Psychiatric:        Mood and Affect: Mood normal.     Ortho Exam Bilateral knees  good range of motion.  Tenderness over the lateral joint line.  No instability with stressing of the knee.  No abnormal warmth or erythema.  Minimal effusion. Specialty Comments:  No specialty comments available.  Imaging: XR Knee 1-2 Views Right  Result Date: 08/16/2019 Right knee 2 views: No acute findings.  Tricompartmental changes throughout the knee as seen on previous films back in April.  Knee is well located.    PMFS History: There are no problems to display for this patient.  Past Medical History:  Diagnosis Date  . Dysphonia spastica   . Fibromyalgia   . GERD (gastroesophageal reflux disease)   . Hypertension   . Thyroid disease     History reviewed. No pertinent family history.  Past Surgical History:  Procedure Laterality Date  . COLONOSCOPY WITH PROPOFOL N/A 08/13/2016   Procedure: COLONOSCOPY WITH PROPOFOL;  Surgeon: Garlan Fair, MD;  Location: [facility];  Service: Endoscopy;  Laterality: N/A;  . JOINT REPLACEMENT     both hips   Social History   Occupational History  . Not on file  Tobacco Use  . Smoking status: Former Research scientist (life sciences)  . Smokeless tobacco: Never Used  .  Tobacco comment: QUIT 1965  Substance and Sexual Activity  . Alcohol use: Not on file  . Drug use: Not on file  . Sexual activity: Not on file

## 2019-09-16 IMAGING — DX DG CHEST 2V
2 series · 2 of 2 positions shown · non-contrast
Comparison: None.

CLINICAL DATA: The patient reports she aspirated a vitamin
approximately 2 hours ago.

EXAM:
CHEST - 2 VIEW

[chest pa]
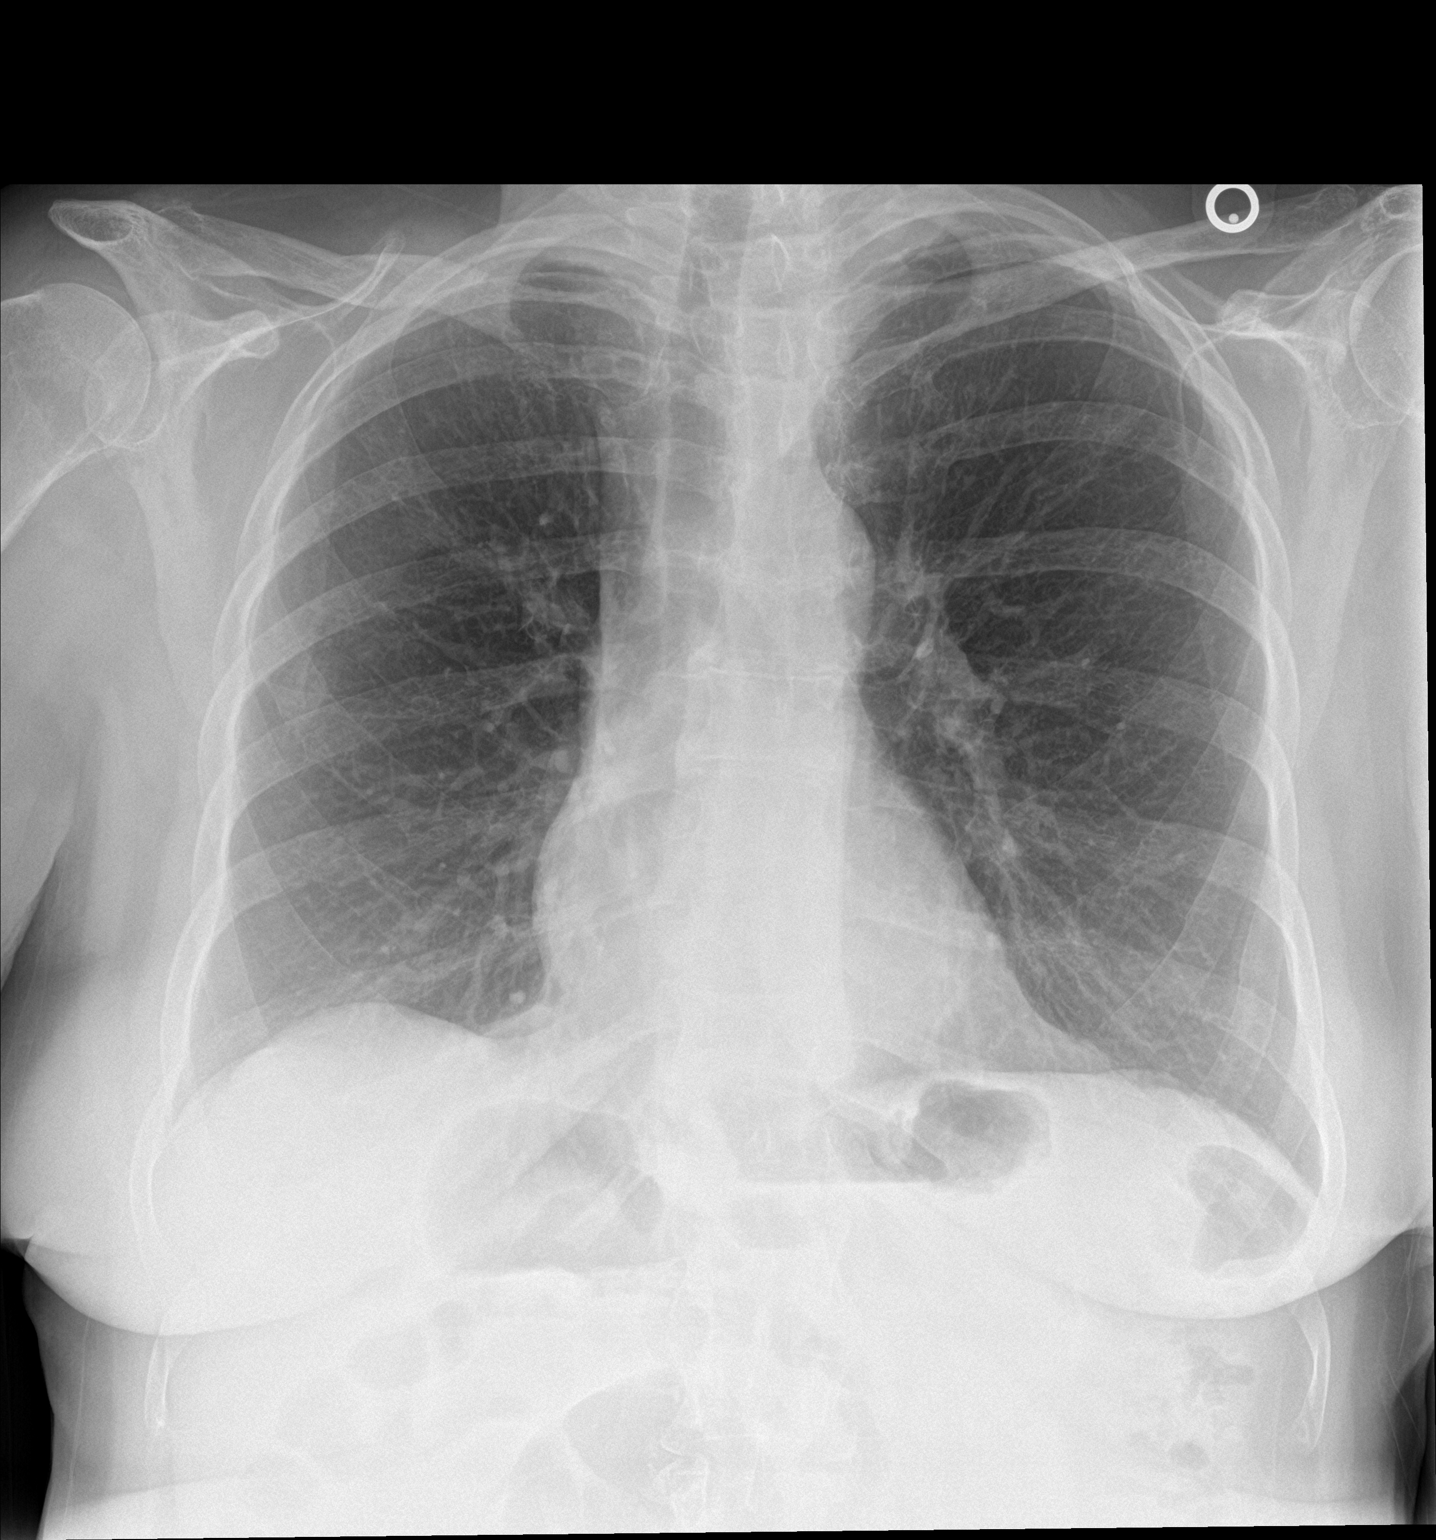

[chest lat]
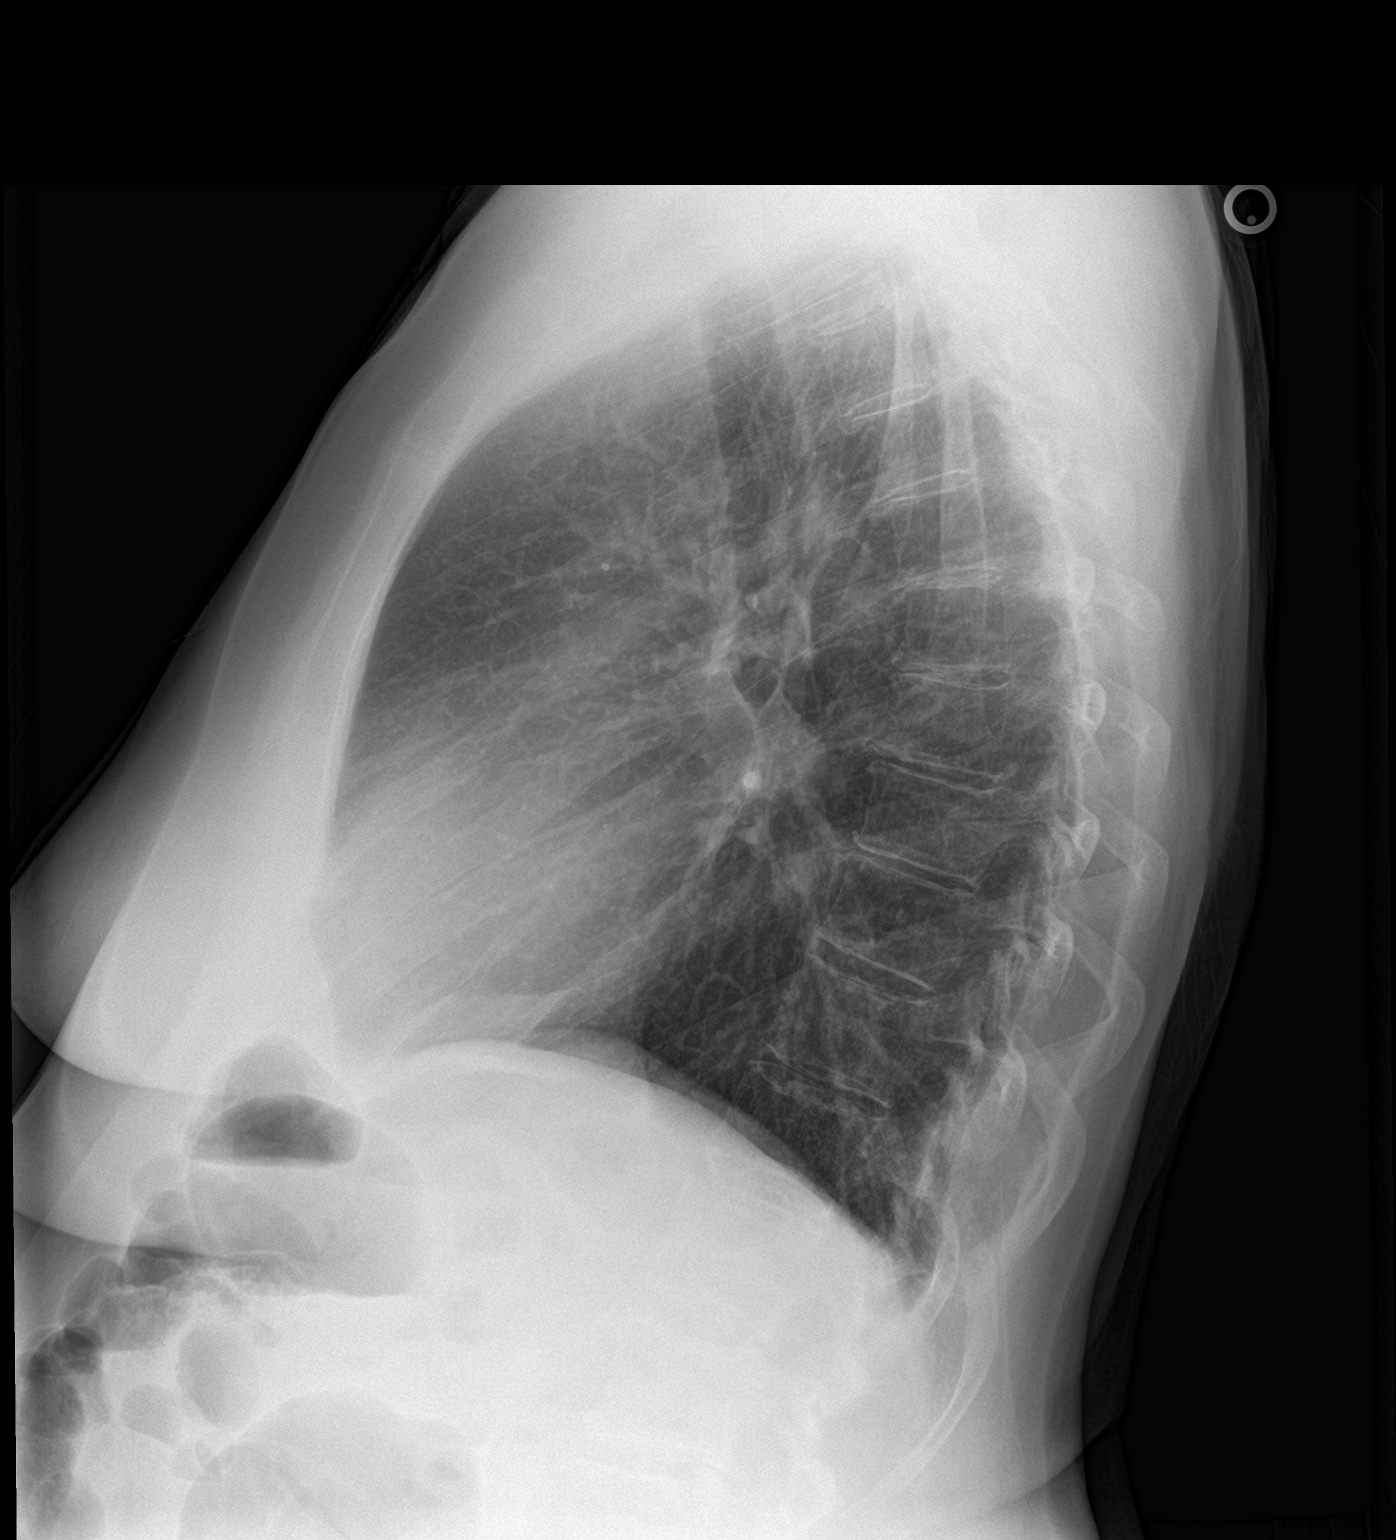

[2 of 2 positions shown; findings below may reference images not displayed]

FINDINGS: No radiopaque foreign body is identified. Lungs are clear. Heart
size is normal. No pneumothorax or pleural effusion. No acute or
focal bony abnormality.
IMPRESSION: Negative chest.  No radiopaque foreign body is seen.

## 2020-01-10 ENCOUNTER — Telehealth: Payer: Self-pay

## 2020-01-10 NOTE — Telephone Encounter (Signed)
Patient wants to know is she can get the Synvisc injection for her right knee pain she would like a call back from [place] call back::[phone].

## 2020-01-10 NOTE — Telephone Encounter (Signed)
Please advise 

## 2020-01-11 NOTE — Telephone Encounter (Signed)
Noted  

## 2020-01-12 NOTE — Telephone Encounter (Signed)
Submitted for VOB for Synivsc one- right knee

## 2020-01-13 ENCOUNTER — Telehealth: Payer: Self-pay

## 2020-01-13 ENCOUNTER — Telehealth: Payer: Self-pay | Admitting: Orthopaedic Surgery

## 2020-01-13 NOTE — Telephone Encounter (Signed)
Patient called. Says her insurance will be back active November 1st. She had moved and they canceled her coverage. She called the insurance company. Her call back number is [phone]

## 2020-01-13 NOTE — Telephone Encounter (Signed)
VOB received pts insurance coverage ended 9/31/21. Need to contact pt to see if she has another insurance

## 2020-01-13 NOTE — Telephone Encounter (Signed)
Pt stated understanding and will call back with her updated insurance information

## 2020-01-13 NOTE — Telephone Encounter (Signed)
I called and informed pt. She hasnt changed insurances. She stated she would look into this and call us back.

## 2020-01-13 NOTE — Telephone Encounter (Signed)
Called pt and informed her to call us back on the 1st for [place] to rerun her benefits

## 2020-02-07 ENCOUNTER — Telehealth: Payer: Self-pay | Admitting: Orthopaedic Surgery

## 2020-02-07 ENCOUNTER — Telehealth: Payer: Self-pay

## 2020-02-07 NOTE — Telephone Encounter (Signed)
Patient called asked for a call back concerning her insurance. The number to contact patient [phone]

## 2020-02-07 NOTE — Telephone Encounter (Signed)
Approved for Synvisc-One-Bilateral knee Dr. Margarito Liner and Bill $35 copay 20% OOP No prior auth required

## 2020-02-07 NOTE — Telephone Encounter (Signed)
Submitted for VOB for Synvisc one-Right knee  °

## 2020-02-07 NOTE — Telephone Encounter (Signed)
I spoke with the pt and she decided she wanted to do the gel injection for both knees. VOB for left knee completed as well.  Pt also would like to know Gil's opinion on if orthotics would be better than gel injections for her. She stated she spoke with a nurse friend that told her the gel injections dont help but orthotics did. Please advise

## 2020-02-07 NOTE — Telephone Encounter (Signed)
I called pt to inform and she stated she read the message wrong and It was orthovisc not orthotics. I informed the pt that the brand of gel injection is determined by insurance. Her insurance covers Durolane or synvisc one. Pt stated understanding and will proceed with whichever [facility] recommends. I spoke with Artis Delay and he recommends synvisc one. So will continue with VOB for synvisc one-bilateral knee

## 2020-02-07 NOTE — Telephone Encounter (Signed)
Would try the injections as she has arthritis in both knees.

## 2020-02-14 ENCOUNTER — Encounter: Payer: Self-pay | Admitting: Physician Assistant

## 2020-02-14 ENCOUNTER — Ambulatory Visit (INDEPENDENT_AMBULATORY_CARE_PROVIDER_SITE_OTHER): Payer: Medicare Other | Admitting: Physician Assistant

## 2020-02-14 DIAGNOSIS — M1712 Unilateral primary osteoarthritis, left knee: Secondary | ICD-10-CM

## 2020-02-14 DIAGNOSIS — M1711 Unilateral primary osteoarthritis, right knee: Secondary | ICD-10-CM | POA: Diagnosis not present

## 2020-02-14 DIAGNOSIS — M1610 Unilateral primary osteoarthritis, unspecified hip: Secondary | ICD-10-CM | POA: Insufficient documentation

## 2020-02-14 MED ORDER — LIDOCAINE HCL 1 % IJ SOLN
5.0000 mL | INTRAMUSCULAR | Status: AC | PRN
  Administered 2020-02-14: 5 mL

## 2020-02-14 MED ORDER — HYLAN G-F 20 48 MG/6ML IX SOSY
48.0000 mg | PREFILLED_SYRINGE | INTRA_ARTICULAR | Status: AC | PRN
  Administered 2020-02-14: 48 mg via INTRA_ARTICULAR

## 2020-02-14 NOTE — Progress Notes (Signed)
   Procedure Note  Patient: Kathryn Pope             Date of Birth: 03/20/1941           MRN: [id number]             Visit Date: 02/14/2020 HPI: Ms. Bushard returns today for bilateral Synvisc 1 injections.  She has known arthritis of both knees.  She has had no new injury to either knee.  Physical exam: Bilateral knees good range of motion no instability valgus varus stressing.  No abnormal warmth erythema of either knee.  Procedures: Visit Diagnoses:  1. Primary osteoarthritis of left knee   2. Primary osteoarthritis of right knee     Large Joint Inj: bilateral knee on 02/14/2020 5:06 PM Indications: pain Details: 22 G 1.5 in needle, superolateral approach  Arthrogram: No  Medications (Right): 5 mL lidocaine 1 %; 48 mg Hylan 48 MG/6ML Medications (Left): 5 mL lidocaine 1 %; 48 mg Hylan 48 MG/6ML Outcome: tolerated well, no immediate complications Procedure, treatment alternatives, risks and benefits explained, specific risks discussed. Consent was given by the patient. Immediately prior to procedure a time out was called to verify the correct patient, procedure, equipment, support staff and site/side marked as required. Patient was prepped and draped in the usual sterile fashion.     Plan: She will follow up with [place] on as-needed basis.  She understands to wait at least 6 months between supplemental injections.  Questions were encouraged and answered.

## 2020-03-09 ENCOUNTER — Telehealth: Payer: Self-pay

## 2020-03-09 NOTE — Telephone Encounter (Signed)
No would have her see her PCP

## 2020-03-09 NOTE — Telephone Encounter (Signed)
Patient called she stated she received a injection 11/8 she wants to know if its possible the injection is making her sick she stated she feels weak and her knee is still in pain she can barley walk [phone].

## 2020-03-10 NOTE — Telephone Encounter (Signed)
Pt called back to speak with Autumn

## 2020-03-10 NOTE — Telephone Encounter (Signed)
FYI   I spoke with the pt. She stated she went to the ED yesterday and all her blood work came back normal. She is feeling some better today but was unable to even put weight on her knee yesterday. I informed her that since the injection is placed directly into the knee that if she is having a reaction to the medication it would show as swelling and redness to the knee. I instructed pt to call us Monday to be worked in if she shows any signs of swelling/redness to that knee. Pt stated understanding

## 2020-03-10 NOTE — Telephone Encounter (Signed)
Lvm for pt to call back to inform
